# Patient Record
Sex: Male | Born: 1965 | Race: Black or African American | Hispanic: No | Marital: Married | Smoking: Former smoker
Health system: Southern US, Community
[De-identification: ages and names within clinical notes are randomized; demographics above are authoritative.]

## PROBLEM LIST (undated history)

## (undated) DIAGNOSIS — N289 Disorder of kidney and ureter, unspecified: Secondary | ICD-10-CM

## (undated) DIAGNOSIS — I10 Essential (primary) hypertension: Secondary | ICD-10-CM

---

## 2013-03-23 ENCOUNTER — Emergency Department (HOSPITAL_COMMUNITY): Payer: Medicare Other

## 2013-03-23 ENCOUNTER — Encounter (HOSPITAL_COMMUNITY): Payer: Self-pay | Admitting: Emergency Medicine

## 2013-03-23 ENCOUNTER — Inpatient Hospital Stay (HOSPITAL_COMMUNITY)
Admission: EM | Admit: 2013-03-23 | Discharge: 2013-03-24 | DRG: 189 | Disposition: A | Discharge status: Home / Self Care | Payer: Medicare Other | Attending: Internal Medicine | Admitting: Internal Medicine

## 2013-03-23 DIAGNOSIS — E875 Hyperkalemia: Secondary | ICD-10-CM | POA: Diagnosis present

## 2013-03-23 DIAGNOSIS — I120 Hypertensive chronic kidney disease with stage 5 chronic kidney disease or end stage renal disease: Secondary | ICD-10-CM | POA: Diagnosis present

## 2013-03-23 DIAGNOSIS — I10 Essential (primary) hypertension: Secondary | ICD-10-CM | POA: Diagnosis present

## 2013-03-23 DIAGNOSIS — J810 Acute pulmonary edema: Principal | ICD-10-CM | POA: Diagnosis present

## 2013-03-23 DIAGNOSIS — R0602 Shortness of breath: Secondary | ICD-10-CM | POA: Diagnosis present

## 2013-03-23 DIAGNOSIS — J811 Chronic pulmonary edema: Secondary | ICD-10-CM | POA: Diagnosis present

## 2013-03-23 DIAGNOSIS — N186 End stage renal disease: Secondary | ICD-10-CM | POA: Diagnosis present

## 2013-03-23 DIAGNOSIS — Z992 Dependence on renal dialysis: Secondary | ICD-10-CM

## 2013-03-23 DIAGNOSIS — Z87891 Personal history of nicotine dependence: Secondary | ICD-10-CM

## 2013-03-23 DIAGNOSIS — D649 Anemia, unspecified: Secondary | ICD-10-CM | POA: Diagnosis present

## 2013-03-23 HISTORY — DX: Disorder of kidney and ureter, unspecified: N28.9

## 2013-03-23 HISTORY — DX: Essential (primary) hypertension: I10

## 2013-03-23 LAB — BASIC METABOLIC PANEL
BUN: 70 mg/dL — ABNORMAL HIGH (ref 6–23)
CALCIUM: 9.6 mg/dL (ref 8.4–10.5)
CO2: 24 mEq/L (ref 19–32)
Chloride: 97 mEq/L (ref 96–112)
Creatinine, Ser: 11.1 mg/dL — ABNORMAL HIGH (ref 0.50–1.35)
GFR calc non Af Amer: 5 mL/min — ABNORMAL LOW (ref >90)
GFR, EST AFRICAN AMERICAN: 6 mL/min — AB (ref >90)
Glucose, Bld: 105 mg/dL — ABNORMAL HIGH (ref 70–99)
Potassium: 6 mEq/L — ABNORMAL HIGH (ref 3.7–5.3)
Sodium: 140 mEq/L (ref 137–147)

## 2013-03-23 LAB — CBC WITH DIFFERENTIAL/PLATELET
BASOS ABS: 0.1 10*3/uL (ref 0.0–0.1)
Basophils Relative: 1 % (ref 0–1)
EOS PCT: 4 % (ref 0–5)
Eosinophils Absolute: 0.3 10*3/uL (ref 0.0–0.7)
HCT: 36.8 % — ABNORMAL LOW (ref 39.0–52.0)
Hemoglobin: 12.1 g/dL — ABNORMAL LOW (ref 13.0–17.0)
Lymphocytes Relative: 16 % (ref 12–46)
Lymphs Abs: 1.2 10*3/uL (ref 0.7–4.0)
MCH: 29.6 pg (ref 26.0–34.0)
MCHC: 32.9 g/dL (ref 30.0–36.0)
MCV: 90 fL (ref 78.0–100.0)
MONO ABS: 0.6 10*3/uL (ref 0.1–1.0)
Monocytes Relative: 8 % (ref 3–12)
Neutro Abs: 5.4 10*3/uL (ref 1.7–7.7)
Neutrophils Relative %: 71 % (ref 43–77)
Platelets: 199 10*3/uL (ref 150–400)
RBC: 4.09 MIL/uL — ABNORMAL LOW (ref 4.22–5.81)
RDW: 17.3 % — AB (ref 11.5–15.5)
WBC: 7.7 10*3/uL (ref 4.0–10.5)

## 2013-03-23 LAB — TROPONIN I: Troponin I: <0.3 ng/mL (ref <0.30)

## 2013-03-23 LAB — GLUCOSE, RANDOM: Glucose, Bld: 147 mg/dL — ABNORMAL HIGH (ref 70–99)

## 2013-03-23 MED ORDER — LABETALOL HCL 200 MG PO TABS
200.0000 mg | ORAL_TABLET | Freq: Two times a day (BID) | ORAL | Status: DC
  Administered 2013-03-23 – 2013-03-24 (×2): 200 mg via ORAL
  Filled 2013-03-23 (×3): qty 1

## 2013-03-23 MED ORDER — SODIUM CHLORIDE 0.9 % IV SOLN
1.0000 g | Freq: Once | INTRAVENOUS | Status: AC
  Administered 2013-03-23: 1 g via INTRAVENOUS
  Filled 2013-03-23: qty 10

## 2013-03-23 MED ORDER — CALCIUM ACETATE 667 MG PO CAPS
3335.0000 mg | ORAL_CAPSULE | Freq: Three times a day (TID) | ORAL | Status: DC
  Administered 2013-03-24: 3335 mg via ORAL
  Filled 2013-03-23 (×4): qty 5

## 2013-03-23 MED ORDER — SODIUM CHLORIDE 0.9 % IJ SOLN
3.0000 mL | Freq: Two times a day (BID) | INTRAMUSCULAR | Status: DC
  Administered 2013-03-23: 3 mL via INTRAVENOUS

## 2013-03-23 MED ORDER — PANTOPRAZOLE SODIUM 40 MG PO TBEC
40.0000 mg | DELAYED_RELEASE_TABLET | Freq: Every day | ORAL | Status: DC
  Administered 2013-03-24: 40 mg via ORAL
  Filled 2013-03-23: qty 1

## 2013-03-23 MED ORDER — LISINOPRIL 20 MG PO TABS: 30.0000 mg | ORAL_TABLET | Freq: Every day | ORAL | Status: DC

## 2013-03-23 MED ORDER — INSULIN ASPART 100 UNIT/ML IV SOLN
5.0000 [IU] | Freq: Once | INTRAVENOUS | Status: AC
  Administered 2013-03-23: 5 [IU] via INTRAVENOUS
  Filled 2013-03-23: qty 0.05

## 2013-03-23 MED ORDER — SODIUM BICARBONATE 8.4 % IV SOLN
50.0000 meq | Freq: Once | INTRAVENOUS | Status: AC
  Administered 2013-03-23: 50 meq via INTRAVENOUS
  Filled 2013-03-23: qty 50

## 2013-03-23 MED ORDER — DEXTROSE 50 % IV SOLN
1.0000 | Freq: Once | INTRAVENOUS | Status: AC
  Administered 2013-03-23: 50 mL via INTRAVENOUS
  Filled 2013-03-23: qty 50

## 2013-03-23 MED ORDER — HEPARIN SODIUM (PORCINE) 5000 UNIT/ML IJ SOLN
5000.0000 [IU] | Freq: Three times a day (TID) | INTRAMUSCULAR | Status: DC
  Administered 2013-03-23 – 2013-03-24 (×2): 5000 [IU] via SUBCUTANEOUS
  Filled 2013-03-23 (×5): qty 1

## 2013-03-23 MED ORDER — LISINOPRIL 20 MG PO TABS
30.0000 mg | ORAL_TABLET | Freq: Every day | ORAL | Status: DC
  Administered 2013-03-24: 30 mg via ORAL
  Filled 2013-03-23: qty 1

## 2013-03-23 MED ORDER — CLONIDINE HCL 0.3 MG PO TABS
0.3000 mg | ORAL_TABLET | Freq: Two times a day (BID) | ORAL | Status: DC
  Administered 2013-03-23 – 2013-03-24 (×2): 0.3 mg via ORAL
  Filled 2013-03-23 (×3): qty 1

## 2013-03-23 MED ORDER — AMLODIPINE BESYLATE 10 MG PO TABS
10.0000 mg | ORAL_TABLET | Freq: Every day | ORAL | Status: DC
  Administered 2013-03-24: 10 mg via ORAL
  Filled 2013-03-23: qty 1

## 2013-03-23 NOTE — ED Provider Notes (Signed)
CSN: [id number]     Arrival date & time 03/23/13  1053 History   First MD Initiated Contact with Patient 03/23/13 1146     Chief Complaint  Patient presents with  . Shortness of Breath     (Consider location/radiation/quality/duration/timing/severity/associated sxs/prior Treatment) Patient is a 48 y.o. male presenting with shortness of breath.  Shortness of Breath Severity:  Moderate Onset quality:  Gradual Duration:  1 day Timing:  Constant Progression:  Worsening Chronicity:  Recurrent Context comment:  Supposed to dialyze on Fridays.  Had full session wednesday. Relieved by:  Nothing Worsened by:  Exertion Associated symptoms: no abdominal pain, no chest pain, no cough, no fever, no syncope and no vomiting   Associated symptoms comment:  Fatigue   Past Medical History  Diagnosis Date  . Renal disorder   . Hypertension    History reviewed. No pertinent past surgical history. No family history on file. History  Substance Use Topics  . Smoking status: Former Smoker -- 0.25 packs/day for 15 years    Types: Cigarettes  . Smokeless tobacco: Never Used  . Alcohol Use: No    Review of Systems  Constitutional: Negative for fever.  HENT: Negative for congestion.   Respiratory: Positive for shortness of breath. Negative for cough.   Cardiovascular: Negative for chest pain and syncope.  Gastrointestinal: Negative for nausea, vomiting, abdominal pain and diarrhea.  All other systems reviewed and are negative.      Allergies  Iodine and Shellfish allergy  Home Medications   Current Outpatient Rx  Name  Route  Sig  Dispense  Refill  . amLODipine (NORVASC) 10 MG tablet   Oral   Take 10 mg by mouth daily.         . calcium acetate (PHOSLO) 667 MG capsule   Oral   Take 3,335 mg by mouth 3 (three) times daily with meals.         . cloNIDine (CATAPRES) 0.3 MG tablet   Oral   Take 0.3 mg by mouth 2 (two) times daily.         Marland Kitchen labetalol (NORMODYNE) 200 MG  tablet   Oral   Take 200 mg by mouth 2 (two) times daily.         Marland Kitchen lisinopril (PRINIVIL,ZESTRIL) 30 MG tablet   Oral   Take 30 mg by mouth daily.         Marland Kitchen omeprazole (PRILOSEC) 20 MG capsule   Oral   Take 20 mg by mouth daily.          BP 163/99  Pulse 77  Temp(Src) 97.5 F (36.4 C) (Oral)  Resp 18  SpO2 98% Physical Exam  Nursing note and vitals reviewed. Constitutional: He is oriented to person, place, and time. He appears well-developed and well-nourished. No distress.  HENT:  Head: Normocephalic and atraumatic.  Mouth/Throat: Oropharynx is clear and moist.  Eyes: Conjunctivae are normal. Pupils are equal, round, and reactive to light. No scleral icterus.  Neck: Neck supple.  Cardiovascular: Normal rate, regular rhythm, normal heart sounds and intact distal pulses.   No murmur heard. Pulmonary/Chest: Effort normal and breath sounds normal. No stridor. No respiratory distress. He has no wheezes. He has no rales.  Abdominal: Soft. He exhibits no distension. There is no tenderness.  Musculoskeletal: Normal range of motion. He exhibits no edema.  Neurological: He is alert and oriented to person, place, and time.  Skin: Skin is warm and dry. No rash noted.  Psychiatric: He has  a normal mood and affect. His behavior is normal.    ED Course  CRITICAL CARE Performed by: Blake DivineWOFFORD, Tashe Purdon DAVID III Authorized by: Blake DivineWOFFORD, Adore Kithcart DAVID III Total critical care time: 30 minutes Critical care time was exclusive of separately billable procedures and treating other patients. Critical care was necessary to treat or prevent imminent or life-threatening deterioration of the following conditions: renal failure. Critical care was time spent personally by me on the following activities: development of treatment plan with patient or surrogate, discussions with consultants, evaluation of patient's response to treatment, examination of patient, obtaining history from patient or surrogate,  ordering and performing treatments and interventions, ordering and review of laboratory studies, ordering and review of radiographic studies, pulse oximetry, re-evaluation of patient's condition and review of old charts.   (including critical care time) Labs Review Labs Reviewed  CBC WITH DIFFERENTIAL - Abnormal; Notable for the following:    RBC 4.09 (*)    Hemoglobin 12.1 (*)    HCT 36.8 (*)    RDW 17.3 (*)    All other components within normal limits  BASIC METABOLIC PANEL - Abnormal; Notable for the following:    Potassium 6.0 (*)    Glucose, Bld 105 (*)    BUN 70 (*)    Creatinine, Ser 11.10 (*)    GFR calc non Af Amer 5 (*)    GFR calc Af Amer 6 (*)    All other components within normal limits  TROPONIN I  GLUCOSE, RANDOM   Imaging Review Dg Chest Port 1 View  03/23/2013   CLINICAL DATA:  Shortness of breath  EXAM: PORTABLE CHEST - 1 VIEW  COMPARISON:  None.  FINDINGS: The heart size is enlarged. The mediastinal contour is normal. There is pulmonary edema. There is no focal pneumonia or pleural effusion. The visualized bony structures are normal.  IMPRESSION: Congestive heart failure.   Electronically Signed   By: Sherian ReinWei-Chen  Lin M.D.   On: 03/23/2013 12:49  All radiology studies independently viewed by me.      EKG Interpretation   Date/Time:  Friday March 23 2013 11:16:21 EDT Ventricular Rate:  63 PR Interval:  181 QRS Duration: 150 QT Interval:  500 QTC Calculation: 512 R Axis:   -121 Text Interpretation:  Sinus rhythm Probable left atrial enlargement  Consider left ventricular hypertrophy Abnormal T, consider ischemia,  lateral leads Prolonged QT interval Baseline wander in lead(s) V4 No old  tracing to compare Confirmed by [facility]  MD, TREY ([id number]) on 03/23/2013  1:05:30 PM      MDM   Final diagnoses:  Shortness of breath  Hyperkalemia  End stage renal disease    48 yo male with hx of ESRD presenting with CC of shortness of breath.  Nontoxic, not distressed  on exam.    Labs show hyperkalemia. EKG shows some mild T-wave peaking. Chest x-ray shows pulmonary edema. Will consult nephrology for dialysis.   Given IV calcium, insulin, bicarb, D50.    4:03 PM Discussed with Dr. Arta SilenceShertz (Nephrology).  Discussed with Dr. Elvera LennoxGherghe who [facility].    Candyce ChurnJohn David Sindy Mccune III, MD 03/23/13 [phone]

## 2013-03-23 NOTE — H&P (Signed)
History and Physical    Lawrence BarrierDarryl Webb BJY:[id number] DOB: 1965-12-02 DOA: 03/23/2013  Referring physician: Dr. Loretha StaplerWofford PCP: No primary provider on file.  Specialists: nephrology, Dr. Arlean HoppingSchertz  Chief Complaint: Shortness of breath  HPI: Lawrence BarrierDarryl Webb is a 48 y.o. male has a past medical history significant for hypertension, which resulted in end-stage renal disease on hemodialysis since 2009, Mondays Wednesdays and Fridays, usually [place] he lives, presents to the emergency room with a chief complaint of shortness of breath. He is visiting [place] to see some basketball games, and was planning on returning tomorrow. She was supposed to have a hemodialysis session today. He felt progressive shortness of breath and decided to come to the emergency room. He denies any fever or chills, he denies any chest pain, has no lightheadedness or dizziness. Has no abdominal pain, nausea, vomiting or diarrhea. In the emergency room, chest x-ray showed pulmonary edema. Patient vital signs are stable, and his breathing comfortably on room air however feel short of breath with ambulation. ED physician contacted nephrology, Dr. Arlean HoppingSchertz, and patient will be admitted and [facility] dialysis. Blood work remarkable for hyperkalemia with a potassium of 6.0, and creatinine up to 11.  Review of Systems: As per history of present illness, otherwise negative  Past Medical History  Diagnosis Date  . Renal disorder   . Hypertension    History reviewed. No pertinent past surgical history. Social History:  reports that he has quit smoking. His smoking use included Cigarettes. He has a 3.75 pack-year smoking history. He has never used smokeless tobacco. He reports that he does not drink alcohol or use illicit drugs.  Allergies  Allergen Reactions  . Iodine     Lips break with fever blisters   . Shellfish Allergy     Lips break with fever blisters     Family history not contributory  Prior to Admission medications   Medication Sig Start Date End Date Taking? Authorizing Provider  amLODipine (NORVASC) 10 MG tablet Take 10 mg by mouth daily.   Yes Historical Provider, MD  calcium acetate (PHOSLO) 667 MG capsule Take 3,335 mg by mouth 3 (three) times daily with meals.   Yes Historical Provider, MD  cloNIDine (CATAPRES) 0.3 MG tablet Take 0.3 mg by mouth 2 (two) times daily.   Yes Historical Provider, MD  labetalol (NORMODYNE) 200 MG tablet Take 200 mg by mouth 2 (two) times daily.   Yes Historical Provider, MD  lisinopril (PRINIVIL,ZESTRIL) 30 MG tablet Take 30 mg by mouth daily.   Yes Historical Provider, MD  omeprazole (PRILOSEC) 20 MG capsule Take 20 mg by mouth daily.   Yes Historical Provider, MD   Physical Exam: Filed Vitals:   03/23/13 1112 03/23/13 1150 03/23/13 1444  BP: 143/93 163/99 157/99  Pulse: 68 77 74  Temp: 97.5 F (36.4 C)    TempSrc: Oral    Resp: 16 18 16   SpO2: 99% 98% 97%     General:  No apparent distress  Eyes: PERRL, EOMI, no scleral icterus  ENT: moist oropharynx  Neck: supple, no JVD  Cardiovascular: regular rate without MRG; 2+ peripheral pulses  Respiratory: Mild rhonchi bilaterally, no wheezing  Abdomen: soft, non tender to palpation, positive bowel sounds, no guarding, no rebound  Skin: no rashes  Musculoskeletal: Trace lower extremity edema  Psychiatric: normal mood and affect  Neurologic: CN 2-12 grossly intact, MS 5/5 in all 4  Labs on Admission:  Basic Metabolic Panel:  Recent Labs Lab 03/23/13 1425  NA 140  K 6.0*  CL 97  CO2 24  GLUCOSE 105*  BUN 70*  CREATININE 11.10*  CALCIUM 9.6   CBC:  Recent Labs Lab 03/23/13 1425  WBC 7.7  NEUTROABS 5.4  HGB 12.1*  HCT 36.8*  MCV 90.0  PLT 199   Cardiac Enzymes:  Recent Labs Lab 03/23/13 1425  TROPONINI <0.30    Radiological Exams on Admission: Dg Chest Port 1 View  03/23/2013   CLINICAL DATA:   Shortness of breath  EXAM: PORTABLE CHEST - 1 VIEW  COMPARISON:  None.  FINDINGS: The heart size is enlarged. The mediastinal contour is normal. There is pulmonary edema. There is no focal pneumonia or pleural effusion. The visualized bony structures are normal.  IMPRESSION: Congestive heart failure.   Electronically Signed   By: Sherian Rein M.D.   On: 03/23/2013 12:49    EKG: Independently reviewed.  Assessment/Plan Active Problems:   Shortness of breath   ESRD (end stage renal disease)   HTN (hypertension)   Pulmonary edema   Hyperkalemia    Shortness of breath - due to pulmonary edema in the setting of missing his dialysis session and fluid overload. Patient dry weight is around 70 kg. He is to be transferred to [place] for dialysis. Nephrology has been consulted by the ED physician. ESRD - to undergo hemodialysis at [place] Hyperkalemia - due to #2. Pulmonary edema Hypertension - restart home medications    Diet: Renal Fluids: None DVT Prophylaxis: Heparin  Code Status: Full (presumed)  Family Communication: None  Disposition Plan: Inpatient  Time spent: 50  This note has been created with Education officer, environmental. Any transcriptional errors are unintentional.   Kaylene Dawn M. Elvera Lennox, MD [facility] Pager [phone]  If 7PM-7AM, please contact night-coverage [email or web address] Password [facility] 03/23/2013, 4:34 PM

## 2013-03-23 NOTE — ED Notes (Addendum)
Pt reports having shortness of breath that started last night. Pt reports being on dialysis (MWF), however missed his appointment this morning due to being out of town. Pt reports normally getting his dialysis conducted at [facility] in [place]. Pt is A/O x4, vitals are WDL.

## 2013-03-23 NOTE — ED Notes (Signed)
IV access attempted x2 without success, another RN to attempt.

## 2013-03-23 NOTE — Consult Note (Signed)
Renal Service Consult Note  [facility]  Syed Barnie DelWesby 03/23/2013 Lawrence Webb D Requesting Physician:  Dr Elvera LennoxGherghe  Reason for Consult:  [place] [facility] here with SOB HPI: The patient is a 48 y.o. year-old with HTN and ESRD on HD since 2009 [place].  [facility] and was supposed to have HD today.  Is SOB, +orthopnea, no cough or chills or fever.  No CP.  Gets 3.5h of HD on MWF schedule, dry wt is 65kg but he "never gets to that" coming off at 70-71 kg usually.   ROS  no abd pain, n/v/d  no jt pain  no skin rash  no HA or confusion  Past Medical History  Past Medical History  Diagnosis Date  . Renal disorder   . Hypertension    Past Surgical History History reviewed. No pertinent past surgical history. Family History No family history on file. Social History  reports that he has quit smoking. His smoking use included Cigarettes. He has a 3.75 pack-year smoking history. He has never used smokeless tobacco. He reports that he does not drink alcohol or use illicit drugs. Allergies  Allergies  Allergen Reactions  . Iodine     Lips break with fever blisters   . Shellfish Allergy     Lips break with fever blisters   Home medications Prior to Admission medications   Medication Sig Start Date End Date Taking? Authorizing Provider  amLODipine (NORVASC) 10 MG tablet Take 10 mg by mouth daily.   Yes Historical Provider, MD  calcium acetate (PHOSLO) 667 MG capsule Take 3,335 mg by mouth 3 (three) times daily with meals.   Yes Historical Provider, MD  cloNIDine (CATAPRES) 0.3 MG tablet Take 0.3 mg by mouth 2 (two) times daily.   Yes Historical Provider, MD  labetalol (NORMODYNE) 200 MG tablet Take 200 mg by mouth 2 (two) times daily.   Yes Historical Provider, MD  lisinopril (PRINIVIL,ZESTRIL) 30 MG tablet Take 30 mg by mouth daily.   Yes Historical Provider, MD  omeprazole (PRILOSEC) 20 MG capsule Take 20 mg by mouth daily.   Yes  Historical Provider, MD   Liver Function Tests No results found for this basename: AST, ALT, ALKPHOS, BILITOT, PROT, ALBUMIN,  in the last 168 hours No results found for this basename: LIPASE, AMYLASE,  in the last 168 hours CBC  Recent Labs Lab 03/23/13 1425  WBC 7.7  NEUTROABS 5.4  HGB 12.1*  HCT 36.8*  MCV 90.0  PLT 199   Basic Metabolic Panel  Recent Labs Lab 03/23/13 1425  NA 140  K 6.0*  CL 97  CO2 24  GLUCOSE 105*  BUN 70*  CREATININE 11.10*  CALCIUM 9.6    Filed Vitals:   03/23/13 1112 03/23/13 1150 03/23/13 1444  BP: 143/93 163/99 157/99  Pulse: 68 77 74  Temp: 97.5 F (36.4 C)    TempSrc: Oral    Resp: 16 18 16   SpO2: 99% 98% 97%   Exam: Alert, not in distress, facial edema No rash, cyanosis or gangrene Sclera anicteric, throat clear +JVD Mild bibasilar insp rales RRR no MRG Abd soft, nt, nd, no ascites LE +1 pitting ankle edema bilat Neuro is nf, ox3 LUA AVF is patent   Dialysis[place] MWF [place] 3.5hrs    65kg (but i never get to that, comes off HD at 71kg usually, he brought records)  Hectorol 4   Assessment: 1 Dyspnea / pulm edema / vol excess  2 ESRD , on HD in [place] 3 HTN- on 5 bp medications 4 Anemia Hb 12 5 Hyperkalemia    Plan- [facility], [facility], max UF 5 kg, correct [place] Bouchard, will follow.    Vinson Moselle MD (pgr) [phone]    [phone] 03/23/2013, 5:04 PM

## 2013-03-23 NOTE — ED Notes (Signed)
PCXR at bs.

## 2013-03-23 NOTE — ED Notes (Signed)
Initial Contact - pt resting on stretcher, soundly sleeping.  Arousable to verbal stim.  Pt reports here visiting and missed HD appt this AM.  Pt reports normally due MWF.  Pt sts mild SOB starting last night.  Speaking full/clear sentences, rr even/un-lab.  Sats upper 90s on RA.  LS faint scattered rales at bases.  Pt denies cp/palpitations.  NSR on monitor.  Skin PWD.  AVF to LUE, +bruit/+thrill.   MAEI.  A+Ox4.  NAD.

## 2013-03-23 NOTE — Progress Notes (Signed)
   CARE MANAGEMENT ED NOTE 03/23/2013  [facility]  [facility]   Account Number:  [id number]  Date Initiated:  03/23/2013  Documentation initiated by:  Edd ArbourGIBBS,Lan Mcneill  Subjective/Objective Assessment:   48 yr old medicare pt states he prefers his [facility] nephrologist "Dr Betti Cruzeddy" to be his pcp ESRD [place]     Subjective/Objective Assessment Detail:     Action/Plan:   Action/Plan Detail:   Anticipated DC Date:  03/26/2013     Status Recommendation to Physician:   Result of Recommendation:    Other ED Services  Consult Working Plan    DC Planning Services  Outpatient Services - Pt will follow up  PCP issues  Other    Choice offered to / List presented to:            Status of service:  Completed, signed off  ED Comments:   ED Comments Detail:

## 2013-03-23 NOTE — ED Notes (Signed)
[facility] here to provide safe transport to [facility].  Verbal report provided and NAD upon pt leaving dept.

## 2013-03-24 DIAGNOSIS — E875 Hyperkalemia: Secondary | ICD-10-CM

## 2013-03-24 DIAGNOSIS — I10 Essential (primary) hypertension: Secondary | ICD-10-CM

## 2013-03-24 DIAGNOSIS — N186 End stage renal disease: Secondary | ICD-10-CM

## 2013-03-24 DIAGNOSIS — J811 Chronic pulmonary edema: Secondary | ICD-10-CM

## 2013-03-24 LAB — BASIC METABOLIC PANEL
BUN: 32 mg/dL — ABNORMAL HIGH (ref 6–23)
CALCIUM: 9.3 mg/dL (ref 8.4–10.5)
CO2: 30 mEq/L (ref 19–32)
Chloride: 94 mEq/L — ABNORMAL LOW (ref 96–112)
Creatinine, Ser: 6.68 mg/dL — ABNORMAL HIGH (ref 0.50–1.35)
GFR, EST AFRICAN AMERICAN: 10 mL/min — AB (ref >90)
GFR, EST NON AFRICAN AMERICAN: 9 mL/min — AB (ref >90)
GLUCOSE: 126 mg/dL — AB (ref 70–99)
Potassium: 4.8 mEq/L (ref 3.7–5.3)
Sodium: 138 mEq/L (ref 137–147)

## 2013-03-24 LAB — HEPATITIS B SURFACE ANTIGEN: Hepatitis B Surface Ag: NEGATIVE

## 2013-03-24 MED ORDER — OXYCODONE-ACETAMINOPHEN 5-325 MG PO TABS: 1.0000 | ORAL_TABLET | Freq: Three times a day (TID) | ORAL | Status: AC | PRN

## 2013-03-24 MED ORDER — HYDRALAZINE HCL 20 MG/ML IJ SOLN
10.0000 mg | Freq: Once | INTRAMUSCULAR | Status: AC
  Administered 2013-03-24: 10 mg via INTRAVENOUS
  Filled 2013-03-24: qty 0.5

## 2013-03-24 MED ORDER — METOPROLOL TARTRATE 1 MG/ML IV SOLN
5.0000 mg | Freq: Once | INTRAVENOUS | Status: AC
  Administered 2013-03-24: 5 mg via INTRAVENOUS
  Filled 2013-03-24: qty 5

## 2013-03-24 MED ORDER — OXYCODONE HCL 5 MG PO TABS
5.0000 mg | ORAL_TABLET | Freq: Four times a day (QID) | ORAL | Status: DC | PRN
  Administered 2013-03-24 (×2): 5 mg via ORAL
  Filled 2013-03-24 (×2): qty 1

## 2013-03-24 NOTE — Progress Notes (Signed)
  [facility] Progress Note   Subjective: 4kg off w HD yest, breathing issues resolved  Filed Vitals:   03/24/13 0440 03/24/13 0735 03/24/13 0830 03/24/13 1123  BP: 188/88 181/112 185/112 171/99  Pulse: 93 90 96 98  Temp: 98.4 F (36.9 C)  98 F (36.7 C) 97.9 F (36.6 C)  TempSrc: Oral  Oral Oral  Resp: 20  23 23   Height:      Weight:      SpO2: 91%  98% 100%   Exam: Alert, not in distress Lungs are clear RRR no MRG Abd soft, nt, nd, no ascites  No edema Neuro is nf, ox3 LUA AVF is patent   [place]  3.5hrs 65kg ("but i never get to that", comes off HD at 71kg usually)  Hectorol 4   Assessment:  1 Dyspnea / pulm edema / vol excess- resolved. He is back to usual weight, although not at dry wt. Lungs are clear, SOB resolved, OK for discharge. Says he will be going back to [place] this weekend and will get next HD in [place] on Monday. He knows to restrict fluid intake.  2 ESRD , on [place] in [place]  3 HTN- on 5 bp medications  4 Anemia Hb 12  5 Hyperkalemia- resolved  Plan- as above    Vinson Moselleob Dino Borntreger MD  pager [phone]    cell [phone]  03/24/2013, 12:02 PM     Recent Labs Lab 03/23/13 1425 03/23/13 1650  NA 140  --   K 6.0*  --   CL 97  --   CO2 24  --   GLUCOSE 105* 147*  BUN 70*  --   CREATININE 11.10*  --   CALCIUM 9.6  --    No results found for this basename: AST, ALT, ALKPHOS, BILITOT, PROT, ALBUMIN,  in the last 168 hours  Recent Labs Lab 03/23/13 1425  WBC 7.7  NEUTROABS 5.4  HGB 12.1*  HCT 36.8*  MCV 90.0  PLT 199   . amLODipine  10 mg Oral Daily  . calcium acetate  3,335 mg Oral TID WC  . cloNIDine  0.3 mg Oral BID  . heparin  5,000 Units Subcutaneous 3 times per day  . hydrALAZINE  10 mg Intravenous Once  . labetalol  200 mg Oral BID  . lisinopril  30 mg Oral Daily  . pantoprazole  40 mg Oral Daily  . sodium chloride  3 mL Intravenous Q12H     oxyCODONE

## 2013-03-24 NOTE — Progress Notes (Signed)
Blood pressure is 193/96 after return from dialysis.  Patient is asymptomatic, and resting peacefully.  NP on call notified; orders received for lopressor 5 mg IV.  Will administer medication when made available and if BP still high.  Will continue to monitor.

## 2013-03-24 NOTE — Progress Notes (Signed)
03/24/13 1330 nsg Pt d/c to home per wheelchair. bp went down to 161/92 per Dr. Vanessa BarbaraZamora pt can go home. CN did d/c instructions for pt.

## 2013-03-24 NOTE — Progress Notes (Signed)
Patient had complained of pain in right foot (8/10) earlier tonight due to a past fall while walking on ice.  Requested pain medicine.  NP on call notified, and orders received for oxycodone 5 mg PO PRN.  Patient currently in dialysis, but will monitor when patient returns.

## 2013-03-24 NOTE — Discharge Summary (Signed)
Physician Discharge Summary  Lawrence BarrierDarryl Webb EAV:[id number] DOB: 04/18/1965 DOA: 03/23/2013  PCP: PROVIDER NOT IN SYSTEM  Admit date: 03/23/2013 Discharge date: 03/24/2013  Time spent: 35 minutes  Recommendations for Outpatient Follow-up:  1. Patient undergoing HD during this hospitalization, he was instructed to continue with his scheduled HD sessions in his home town.   Discharge Diagnoses:  Principal Problem:   Shortness of breath Active Problems:   ESRD (end stage renal disease)   Pulmonary edema   HTN (hypertension)   Hyperkalemia   Discharge Condition: Stable  Diet recommendation: Renal Diet  Filed Weights   03/23/13 2011 03/23/13 2155 03/24/13 0201  Weight: 71.668 kg (158 lb) 75.1 kg (165 lb 9.1 oz) 70.4 kg (155 lb 3.3 oz)    History of present illness:  Lawrence Webb is a 48 y.o. male has a past medical history significant for hypertension, which resulted in end-stage renal disease on hemodialysis since 2009, Mondays Wednesdays and Fridays, usually gets his hemodialysis [place] he lives, presents to the emergency room with a chief complaint of shortness of breath. He is visiting [place] to see some basketball games, and was planning on returning tomorrow. She was supposed to have a hemodialysis session today. He felt progressive shortness of breath and decided to come to the emergency room. He denies any fever or chills, he denies any chest pain, has no lightheadedness or dizziness. Has no abdominal pain, nausea, vomiting or diarrhea. In the emergency room, chest x-ray showed pulmonary edema. Patient vital signs are stable, and his breathing comfortably on room air however feel short of breath with ambulation. ED physician contacted nephrology, Dr. Arlean HoppingSchertz, and patient will be admitted and transferred [facility] to undergo dialysis. Blood work remarkable for hyperkalemia with a potassium of 6.0, and creatinine up to 11.   Hospital Course:  Patient is  a pleasant 48 year old gentleman with a past medical history of end-stage renal disease, currently on hemodialysis Mondays Wednesdays and Fridays who is visiting the area from [place]. Unfortunately he missed a dialysis session. He presented initially to the emergency room with complaints of shortness of breath. He was [facility] on 03/23/2013 for further evaluation and treatment. Patient was seen and evaluated by Dr.schertz of nephrology, as patient underwent hemodialysis. He was reassessed on the morning of 03/24/2013 and stated feeling much better. He was breathing comfortably on room and, satting 98%. His blood pressures were noted to be elevated for which he was given 10 mg of IV hydralazine. Patient was instructed to continue his hemodialysis back in his hometown on Mondays Wednesdays and Fridays.   Procedures:  Hemodialysis, performed on 03/23/2013  Consultations:  Nephrology  Discharge Exam: Filed Vitals:   03/24/13 0830  BP: 185/112  Pulse: 96  Temp: 98 F (36.7 C)  Resp: 23    General: Patient is in no acute distress, he is awake alert oriented, expressed a desire to be discharged today. Cardiovascular: Regular rate and rhythm normal S1 S2 Respiratory: Clear to auscultation bilaterally, normal respiratory effort Abdomen: Soft nontender nondistended  Discharge Instructions  Discharge Orders   Future Orders Complete By Expires   Call MD for:  difficulty breathing, headache or visual disturbances  As directed    Call MD for:  extreme fatigue  As directed    Call MD for:  persistant dizziness or light-headedness  As directed    Call MD for:  persistant nausea and vomiting  As directed    Call  MD for:  severe uncontrolled pain  As directed    Call MD for:  temperature >100.4  As directed    Diet - low sodium heart healthy  As directed    Discharge instructions  As directed    Comments:     Please follow up with your Family Physician and  Kidney Doctor when you get home.   Increase activity slowly  As directed        Medication List         amLODipine 10 MG tablet  Commonly known as:  NORVASC  Take 10 mg by mouth daily.     calcium acetate 667 MG capsule  Commonly known as:  PHOSLO  Take 3,335 mg by mouth 3 (three) times daily with meals.     cloNIDine 0.3 MG tablet  Commonly known as:  CATAPRES  Take 0.3 mg by mouth 2 (two) times daily.     labetalol 200 MG tablet  Commonly known as:  NORMODYNE  Take 200 mg by mouth 2 (two) times daily.     lisinopril 30 MG tablet  Commonly known as:  PRINIVIL,ZESTRIL  Take 30 mg by mouth daily.     omeprazole 20 MG capsule  Commonly known as:  PRILOSEC  Take 20 mg by mouth daily.       Allergies  Allergen Reactions  . Iodine     Lips break with fever blisters   . Shellfish Allergy     Lips break with fever blisters       Follow-up Information   Follow up with PROVIDER NOT IN SYSTEM.       The results of significant diagnostics from this hospitalization (including imaging, microbiology, ancillary and laboratory) are listed below for reference.    Significant Diagnostic Studies: Dg Chest Port 1 View  03/23/2013   CLINICAL DATA:  Shortness of breath  EXAM: PORTABLE CHEST - 1 VIEW  COMPARISON:  None.  FINDINGS: The heart size is enlarged. The mediastinal contour is normal. There is pulmonary edema. There is no focal pneumonia or pleural effusion. The visualized bony structures are normal.  IMPRESSION: Congestive heart failure.   Electronically Signed   By: Sherian Rein M.D.   On: 03/23/2013 12:49    Microbiology: No results found for this or any previous visit (from the past 240 hour(s)).   Labs: Basic Metabolic Panel:  Recent Labs Lab 03/23/13 1425 03/23/13 1650  NA 140  --   K 6.0*  --   CL 97  --   CO2 24  --   GLUCOSE 105* 147*  BUN 70*  --   CREATININE 11.10*  --   CALCIUM 9.6  --    Liver Function Tests: No results found for this  basename: AST, ALT, ALKPHOS, BILITOT, PROT, ALBUMIN,  in the last 168 hours No results found for this basename: LIPASE, AMYLASE,  in the last 168 hours No results found for this basename: AMMONIA,  in the last 168 hours CBC:  Recent Labs Lab 03/23/13 1425  WBC 7.7  NEUTROABS 5.4  HGB 12.1*  HCT 36.8*  MCV 90.0  PLT 199   Cardiac Enzymes:  Recent Labs Lab 03/23/13 1425  TROPONINI <0.30   BNP: BNP (last 3 results) No results found for this basename: PROBNP,  in the last 8760 hours CBG: No results found for this basename: GLUCAP,  in the last 168 hours     Signed:  Jeralyn Bennett  [facility] 03/24/2013, 10:53 AM

## 2015-01-04 IMAGING — CR DG CHEST 1V PORT
1 series · 1 of 1 positions shown · non-contrast
Comparison: None.

CLINICAL DATA: Shortness of breath

EXAM:
PORTABLE CHEST - 1 VIEW

[AP]
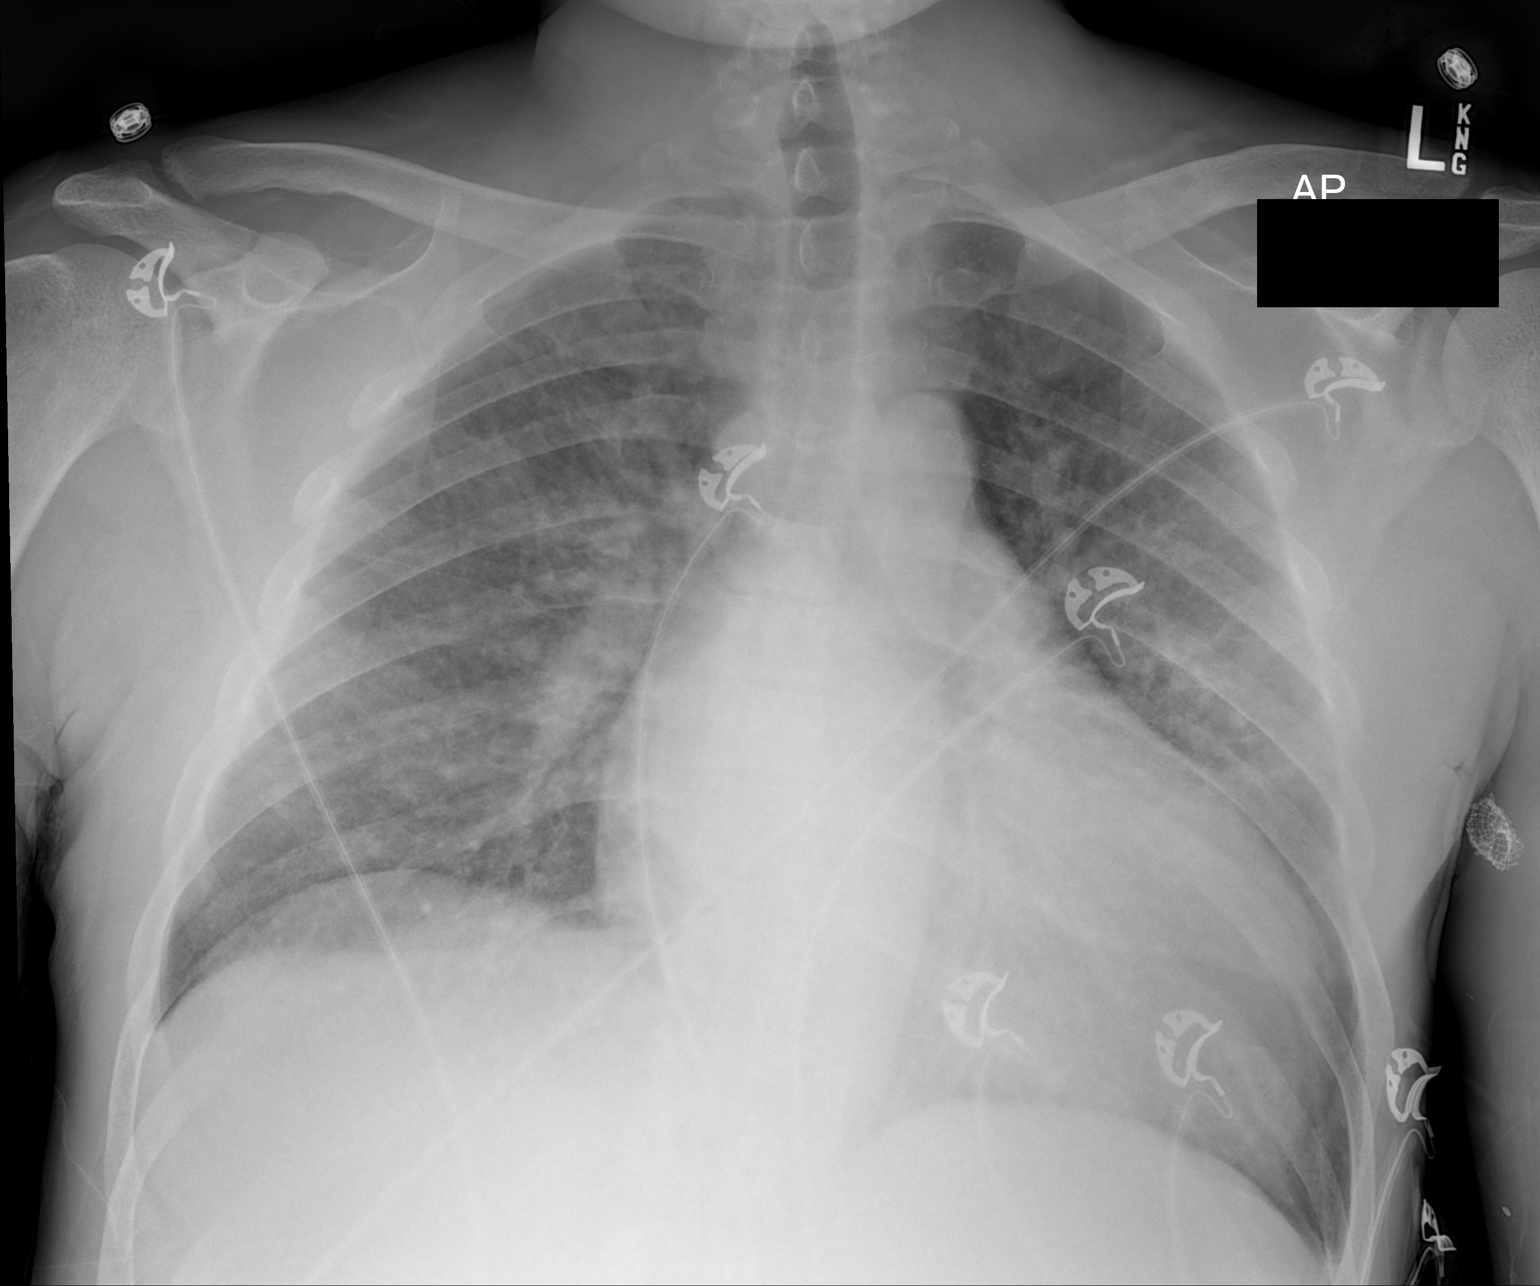

[1 of 1 positions shown; findings below may reference images not displayed]

FINDINGS: The heart size is enlarged. The mediastinal contour is normal. There
is pulmonary edema. There is no focal pneumonia or pleural effusion.
The visualized bony structures are normal.
IMPRESSION: Congestive heart failure.
